# Patient Record
Sex: Male | Born: 1985 | Race: Black or African American | Hispanic: No | Marital: Single | Smoking: Current every day smoker
Health system: Southern US, Community
[De-identification: ages and names within clinical notes are randomized; demographics above are authoritative.]

## PROBLEM LIST (undated history)

## (undated) ENCOUNTER — Emergency Department
Discharge status: Against Medical Advice | Payer: Self-pay | Attending: Emergency Medicine | Admitting: Emergency Medicine

## (undated) HISTORY — PX: ORTHOPEDIC SURGERY: SHX850

---

## 2012-08-02 ENCOUNTER — Emergency Department
Admission: EM | Admit: 2012-08-02 | Disposition: A | Discharge status: Home / Self Care | Payer: Self-pay | Source: Ambulatory Visit

## 2012-10-02 ENCOUNTER — Ambulatory Visit
Admission: RE | Admit: 2012-10-02 | Disposition: A | Discharge status: Home / Self Care | Payer: Self-pay | Source: Ambulatory Visit

## 2013-10-04 DIAGNOSIS — Z5320 Procedure and treatment not carried out because of patient's decision for unspecified reasons: Secondary | ICD-10-CM | POA: Insufficient documentation

## 2013-11-24 ENCOUNTER — Emergency Department: Payer: Self-pay

## 2013-11-24 ENCOUNTER — Emergency Department
Admission: EM | Admit: 2013-11-24 | Discharge: 2013-11-24 | Disposition: A | Discharge status: Home / Self Care | Payer: Self-pay | Attending: Emergency Medicine | Admitting: Emergency Medicine

## 2013-11-24 DIAGNOSIS — F17200 Nicotine dependence, unspecified, uncomplicated: Secondary | ICD-10-CM | POA: Insufficient documentation

## 2013-11-24 DIAGNOSIS — J111 Influenza due to unidentified influenza virus with other respiratory manifestations: Secondary | ICD-10-CM | POA: Insufficient documentation

## 2013-11-24 LAB — URINALYSIS
Bilirubin, UA: NEGATIVE mg/dL
Blood, UA: NEGATIVE mg/dL
Glucose, UA: NEGATIVE mg/dL
Ketones UA: NEGATIVE mg/dL
Leukocyte Esterase, UA: NEGATIVE Leu/uL
Nitrite, UA: NEGATIVE
Protein, UR: NEGATIVE mg/dL
RBC, UA: 3 /hpf (ref 0–4)
Urine Specific Gravity: 1.031 (ref 1.001–1.040)
Urobilinogen, UA: NORMAL mg/dL
WBC, UA: 4 /hpf (ref 0–4)
pH, Urine: 5 pH (ref 5.0–8.0)

## 2013-11-24 MED ORDER — HYDROCODONE-ACETAMINOPHEN 5-325 MG PO TABS
1.0000 | ORAL_TABLET | Freq: Once | ORAL | Status: AC
Start: 2013-11-24 — End: 2013-11-24
  Administered 2013-11-24: 1 via ORAL

## 2013-11-24 MED ORDER — IBUPROFEN 600 MG PO TABS
600.0000 mg | ORAL_TABLET | Freq: Once | ORAL | Status: AC
Start: 2013-11-24 — End: 2013-11-24
  Administered 2013-11-24: 600 mg via ORAL

## 2013-11-24 MED ORDER — HYDROCODONE-ACETAMINOPHEN 5-325 MG PO TABS
ORAL_TABLET | ORAL | Status: AC
Start: 2013-11-24 — End: ?
  Filled 2013-11-24: qty 1

## 2013-11-24 MED ORDER — HYDROCODONE-ACETAMINOPHEN 5-325 MG PO TABS
1.0000 | ORAL_TABLET | Freq: Four times a day (QID) | ORAL | Status: DC | PRN
Start: 2013-11-24 — End: 2018-03-10

## 2013-11-24 MED ORDER — ONDANSETRON 8 MG PO TBDP
8.00 mg | ORAL_TABLET | Freq: Two times a day (BID) | ORAL | Status: AC | PRN
Start: 2013-11-24 — End: ?

## 2013-11-24 MED ORDER — IBUPROFEN 600 MG PO TABS
ORAL_TABLET | ORAL | Status: AC
Start: 2013-11-24 — End: ?
  Filled 2013-11-24: qty 1

## 2013-11-24 NOTE — Discharge Instructions (Signed)
Influenza (Adult)    Influenza, also called the flu, is a viral illness that affects the air passages of the lungs. It differs from the common cold. It is highly contagious. It may be spread through the air by coughing and sneezing or by direct contact (touching the sick person and then touching your own eyes, nose or mouth).  Illness starts 1-3 days after exposure and lasts for 1-2 weeks. Antibiotics are usually not needed unless a complication appears (ear or sinus infection or pneumonia).  Symptoms may be mild or severe and can include extreme tiredness (wanting to stay in bed all day), chills, fevers, muscle aching, soreness with eye movement, headache, and a dry, hacking cough.  Home Care:   Avoid exposure to cigarette smoke (yours or others').   Tylenol or ibuprofen (Advil) will help fever, muscle aching, and headache. To avoid risk of liver injury, aspirin should not be used in children and teenagers under 18 with this illness.   Nausea and loss of appetite are common. A light diet is recommended. Avoid dehydration by drinking 6-8 glasses of fluids per day (water, sport drinks like Gatorade, soft drinks without caffeine, juices, tea, soup, etc.). Extra fluids will also help loosen secretions in the nose and lungs.   Over-the-counter cold medicines will not shorten the duration of the illness but may be helpful for the following symptoms: cough (Robitussin DM); sore throat (Chloraseptic lozenges or spray); nasal and sinus congestion (Actifed or Sudafed). [NOTE: Do not use decongestants if you have high blood pressure.]   Stay home until your fever has been gone for at least 24 hours (without the use of fever-reducing medications such as ibuprofen).  Follow Up  with your doctor or as directed by our staff if you are not improving over the next week.  Note: If you are age 65 or older, or if you have chronic asthma or COPD, we recommend a pneumococcal vaccinationevery five years. All adults shouldreceive  a yearly influenza vaccination every autumn. Ask your doctor about this.  Get Prompt Medical Attention  if any of the following occur:   Cough with lots of colored sputum (mucus) or blood in your sputum   Chest pain, shortness of breath, wheezing, or difficulty breathing   Severe headache, face, neck or ear pain   New rash   Fever of 100.4F (38C) oral or higher, not better with fever medication   Confusion, behavior change or seizure   Severe weakness or dizziness   2000-2014 [employer], [address], [place] [address]. All rights reserved. This information is not intended as a substitute for professional medical care. Always follow your healthcare professional's instructions.

## 2013-11-24 NOTE — ED Provider Notes (Signed)
Physician/Midlevel provider first contact with patient: 11/24/13 4742         EMERGENCY DEPARTMENT HISTORY AND PHYSICAL EXAM      Date Time: 11/24/2013 7:17 AM  Patient Name: Willie Gibbs  Attending Physician: Marvene Staff, MD    Assessment/Plan:   Viral syndrome - likely influenza  Fluids, Zofran, Ibuprofen otc, Norco      MDM   The patient's presentation is suggestive of a viral syndrome. The patient is clinically well appearing. Bacterial or other serious etiologies like strep throat, meningitis, lyme disease, pneumonia were considered but are felt unlikely based upon the history and exam. There is no history of immune compromise. There are no meningeal or sepsis indicators. The patient appears stable for discharge and fever precautions were given. The patient was encouraged to follow-up with their primary care physician as needed and for review of pending labs if instructed to do so.  Diagnostic impression and plan were discussed with the patient and/or family.  Results of lab/radiology tests were reviewed and discussed with the patient and/or family. All questions were answered and concerns addressed. The patient appears appropriate for outpatient management with symptomatic treatment and primary care physician follow-up.  Warned to return immediately for worsening symptoms or any concerns.    History of Presenting Illness:   History provided by:  PATIENT  Willie Gibbs is a 28 y.o. male     PATIENT COMPLAINS OF CONGESTION, BODY ACHES, COUGH, SORE THROAT, AND VOMITING.  SYMPTOMS HAVE BEEN PRESENT FOR ABOUT A WEEK.  PATIENT HAD A FEVER LAST NIGHT.  DENIES RASHES OR URINARY SYMPTOMS, BUT HE TOOK A HOME UTI TEST THAT CAME BACK POSITIVE.      Past Medical History:   History reviewed. No pertinent past medical history.    Past Surgical History:     Past Surgical History   Procedure Date   . Orthopedic surgery        Family History:   History reviewed. No pertinent family history.    Social  History:     History     Social History   . Marital Status: Single     Spouse Name: N/A     Number of Children: N/A   . Years of Education: N/A     Social History Main Topics   . Smoking status: Current Some Day Smoker   . Smokeless tobacco: Not on file   . Alcohol Use: No   . Drug Use: No   . Sexually Active: Not on file     Other Topics Concern   . Not on file     Social History Narrative   . No narrative on file       Allergies:   No Known Allergies    Medications:     Previous Medications    No medications on file        Review of Systems:   Constitutional: FEVER.  No chills    Ears:  Bilateral earache    Nose:  CONGESTION.  No discharge    Throat:  SORE THROAT.  No difficulty swallowing.    Cardiovascular: No chest pain.  No palpitations.    Respiratory: COUGH.  No shortness of breath.    GI:  No abdominal pain.  VOMITING.  No diarrhea.    GU:  No dysuria.    Neurological:  Moderate frontal headache.      Musculoskeletal:  Generalized body pain.    Skin:  No rash.  No  skin lesions.    Psychiatric:  No depression.  No anxiety.      All other systems reviewed and negative except as above, pertinent findings in HPI.      Physical Exam:   Blood pressure 123/94, pulse 103, temperature 99 F (37.2 C), resp. rate 16, height 1.727 m, weight 77.8 kg, SpO2 98.00%.  Constitutional:  Vitals signs reviewed. Moderately ill-appearing.  Well-nourished. No acute distress.    Head:  Atraumatic, normocephalic    Eyes:  Pupils equal, round, and reactive to light and accomodation.  Conjunctiva no injection or erythema    Ears: TMs normal.  Ear canals patent.      Nose:  Mucous membranes moist.  No discharge.     Mouth & Throat:   Mild erythema.  No exudates     Neck:  Supple, non tender.  Moderate cervical lymphadenopathy.    Respiratory:  Breath sounds normal.  No distress    Chest:  Non tender    Cardiovascular:  Heart regular rate and rhythm.  No murmurs/gallops/rubs.  Pulses +2 bilaterally    Back:  No CVA tenderness  bilaterally    Extremities:  Full range of motion.  No edema.  No cyanosis.  No deformity.    Skin:  Warm.  Moist.  No pallor.  No rashes.  No lesions.  No bruises    Neurological:  Alert. GCS 15.  No focal motor deficits.      Psychiatric:  Normal affect.  No anxiety.  No depression.  No agitation.      Lab Results       Labs Reviewed   URINALYSIS   VH CULTURE, URINE       Radiology Results     XR CHEST 2 VIEWS    Final Result: Normal chest.         ReadingStation:[id number]       Labs and Radiological Studies Reviewed    Final Impression     Final diagnoses:   Influenza       Disposition     ED Disposition     Discharge Willie Gibbs discharge to home/self care.    Condition at disposition: Stable            Follow-Up Provider   Frankey Shown, DO  [address]  [place] [address]  [phone]    Schedule an appointment as soon as possible for a visit            Marvene Staff, MD    ATTESTATIONS    The documentation recorded by my scribe, MATTHEW PEWORCHIK, accurately reflects the services I personally performed and the decisions made by me.  Derwin Reddy Ames Dura, MD    Marvene Staff, MD  11/24/13 [phone]

## 2013-11-24 NOTE — ED Notes (Signed)
Patient ambulated to xray without any difficulty

## 2013-11-24 NOTE — ED Notes (Signed)
C/o flu like symptoms for the last week,  Fever, chills, body aches  Today worse cough

## 2014-06-06 ENCOUNTER — Emergency Department (HOSPITAL_COMMUNITY)
Admission: EM | Admit: 2014-06-06 | Discharge: 2014-06-06 | Disposition: A | Discharge status: Home / Self Care | Payer: Self-pay | Attending: Emergency Medicine | Admitting: Emergency Medicine

## 2014-06-06 ENCOUNTER — Encounter (HOSPITAL_COMMUNITY): Payer: Self-pay | Admitting: Emergency Medicine

## 2014-06-06 DIAGNOSIS — Z880 Allergy status to penicillin: Secondary | ICD-10-CM | POA: Insufficient documentation

## 2014-06-06 DIAGNOSIS — L03319 Cellulitis of trunk, unspecified: Principal | ICD-10-CM

## 2014-06-06 DIAGNOSIS — F17200 Nicotine dependence, unspecified, uncomplicated: Secondary | ICD-10-CM | POA: Insufficient documentation

## 2014-06-06 DIAGNOSIS — L02219 Cutaneous abscess of trunk, unspecified: Secondary | ICD-10-CM | POA: Insufficient documentation

## 2014-06-06 DIAGNOSIS — L0291 Cutaneous abscess, unspecified: Secondary | ICD-10-CM

## 2014-06-06 MED ORDER — SULFAMETHOXAZOLE-TRIMETHOPRIM 800-160 MG PO TABS: 1.0000 | ORAL_TABLET | Freq: Two times a day (BID) | ORAL | Status: DC

## 2014-06-06 MED ORDER — CEPHALEXIN 500 MG PO CAPS: ORAL_CAPSULE | ORAL | Status: DC

## 2014-06-06 NOTE — ED Notes (Signed)
Pt complains of boil on Right upper thigh/groin area. Reports that it has come a head and causes back 3/10 and leg pain 8/10. A/O

## 2014-06-06 NOTE — Discharge Instructions (Signed)
Abscess °An abscess (boil or furuncle) is an infected area on or under the skin. This area is filled with yellowish-white fluid (pus) and other material (debris). °HOME CARE  °· Only take medicines as told by your doctor. °· If you were given antibiotic medicine, take it as directed. Finish the medicine even if you start to feel better. °· If gauze is used, follow your doctor's directions for changing the gauze. °· To avoid spreading the infection: °¨ Keep your abscess covered with a bandage. °¨ Wash your hands well. °¨ Do not share personal care items, towels, or whirlpools with others. °¨ Avoid skin contact with others. °· Keep your skin and clothes clean around the abscess. °· Keep all doctor visits as told. °GET HELP RIGHT AWAY IF:  °· You have more pain, puffiness (swelling), or redness in the wound site. °· You have more fluid or blood coming from the wound site. °· You have muscle aches, chills, or you feel sick. °· You have a fever. °MAKE SURE YOU:  °· Understand these instructions. °· Will watch your condition. °· Will get help right away if you are not doing well or get worse. °Document Released: 04/27/2008 Document Revised: 05/10/2012 Document Reviewed: 01/22/2012 °[employer]® Patient Information ©2015 [employer]. This information is not intended to replace advice given to you by your health care provider. Make sure you discuss any questions you have with your health care provider. ° °

## 2014-06-06 NOTE — ED Provider Notes (Signed)
CSN: [id number]     Arrival date & time 06/06/14  1157 History  This chart was scribed for non-physician practitioner Junious Silk, PA-C, working with Suzi Roots, MD by Leone Payor, ED Scribe. This patient was seen in room TR11C/TR11C and the patient's care was started at 1:30 PM.    Chief Complaint  Patient presents with  . Recurrent Skin Infections    boil    The history is provided by the patient. No language interpreter was used.    HPI Comments: Nicholas Howe is a 28 y.o. male who presents to the Emergency Department complaining of 2 days of gradual onset, gradually worsening, constant pain, swelling, and redness to the right groin. He reports similar symptoms in the past when he was diagnosed with abscesses. He describes the pain as a burning sensation. The pain radiates into his leg and back when he walks. He denies trying home remedies for his symptoms. He denies fever, chills, nausea, vomiting.   History reviewed. No pertinent past medical history. History reviewed. No pertinent past surgical history. No family history on file. History  Substance Use Topics  . Smoking status: Current Every Day Smoker -- 0.10 packs/day    Types: Cigarettes  . Smokeless tobacco: Not on file  . Alcohol Use: No    Review of Systems  Constitutional: Negative for fever and chills.  Gastrointestinal: Negative for nausea and vomiting.  Skin: Positive for wound (abscess).  All other systems reviewed and are negative.     Allergies  Penicillins  Home Medications   Prior to Admission medications   Not on File   BP 121/69  Pulse 81  Temp(Src) 98.9 F (37.2 C) (Oral)  Resp 17  Ht 6' (1.829 m)  Wt 160 lb (72.576 kg)  BMI 21.70 kg/m2  SpO2 97% Physical Exam  Nursing note and vitals reviewed. Constitutional: He is oriented to person, place, and time. He appears well-developed and well-nourished. No distress.  HENT:  Head: Normocephalic and atraumatic.  Right Ear: External ear  normal.  Left Ear: External ear normal.  Nose: Nose normal.  Eyes: Conjunctivae are normal.  Neck: Normal range of motion. No tracheal deviation present.  Cardiovascular: Normal rate, regular rhythm and normal heart sounds.   Pulmonary/Chest: Effort normal and breath sounds normal. No stridor.  Abdominal: Soft. He exhibits no distension. There is no tenderness.  Genitourinary:     Musculoskeletal: Normal range of motion.  Neurological: He is alert and oriented to person, place, and time.  Skin: Skin is warm and dry. He is not diaphoretic. There is erythema.  2 cm area of induration and erythema with central pustule to right groin. No testicular involvement.   Psychiatric: He has a normal mood and affect. His behavior is normal.    ED Course  Procedures (including critical care time)  DIAGNOSTIC STUDIES: Oxygen Saturation is 97% on RA, adequate by my interpretation.    COORDINATION OF CARE: 1:38 PM Will perform I&D. Discussed treatment plan with pt at bedside and pt agreed to plan.   Labs Review Labs Reviewed - No data to display  Imaging Review No results found.   EKG Interpretation None      INCISION AND DRAINAGE Performed by: Junious Silk Consent: Verbal consent obtained. Risks and benefits: risks, benefits and alternatives were discussed Type: abscess  Body area: right groin  Anesthesia: local infiltration  Incision was made with a scalpel.  Local anesthetic: lidocaine 2 %   Anesthetic total: 3 ml  Complexity:  complex Blunt dissection to break up loculations  Drainage: purulent  Drainage amount: copious  Patient tolerance: Patient tolerated the procedure well with no immediate complications.     MDM   Final diagnoses:  Abscess    Patient with skin abscess amenable to incision and drainage.  Abscess was not large enough to warrant packing or drain,  wound recheck in 2 days. Encouraged home warm soaks and flushing.  Mild signs of cellulitis  is surrounding skin.  Will d/c to home.    I personally performed the services described in this documentation, which was scribed in my presence. The recorded information has been reviewed and is accurate.    Mora BellmanHannah S Shahzaib Azevedo, PA-C 06/07/14 [phone]

## 2014-06-13 NOTE — ED Provider Notes (Signed)
Medical screening examination/treatment/procedure(s) were performed by non-physician practitioner and as supervising physician I was immediately available for consultation/collaboration.     Suzi RootsKevin E Ifeanyi Mickelson, MD 06/13/14 [phone]

## 2016-01-30 ENCOUNTER — Telehealth: Payer: Self-pay | Admitting: General Practice

## 2016-01-30 NOTE — Telephone Encounter (Signed)
APPT. REMINDER CALL, LMTCB °

## 2016-01-31 ENCOUNTER — Ambulatory Visit (INDEPENDENT_AMBULATORY_CARE_PROVIDER_SITE_OTHER): Payer: BLUE CROSS/BLUE SHIELD | Admitting: Internal Medicine

## 2016-01-31 ENCOUNTER — Encounter: Payer: Self-pay | Admitting: Internal Medicine

## 2016-01-31 VITALS — BP 115/78 | HR 82 | Temp 98.2°F | Ht 72.0 in | Wt 148.9 lb

## 2016-01-31 DIAGNOSIS — N50812 Left testicular pain: Secondary | ICD-10-CM | POA: Diagnosis not present

## 2016-01-31 NOTE — Patient Instructions (Signed)
We will check some labs today. Take ibuprofen 600mg  every 4-6 hours as needed for pain.  If you pain does not improve please call us.

## 2016-01-31 NOTE — Progress Notes (Signed)
Subjective:    Patient ID: Nicholas Howe, male    DOB: 1986-06-22, 30 y.o.   MRN: [id number]  HPI  30 yo male with remote hx of syncope presents to establish care as a new patient.  Has not seen a doctor since his childhood. In his teen years he had few episodes of syncope. Had cardiac monitor place per patient and everything was "normal" per him. He can't remember which doctor he saw for this.  Today he has acute complaint of left testicular discomfort. It started 1 week ago. He feels that the left testicular feels fuller than the right. Not having much pain but just some discomfort especially with walking. Denies any penile discharge or any rashes. No joint pain, lymph node enlargements, night sweats, fever, n/v/d. Sexually active with 1 male partner for last 6 months. No hx of STDs. He feels that due to the discomfort he had some trouble achieving erection few days ago. Feels there might be some performance anxiety causing erection problem as they are still new in the relationship. Denies any injury or heavy lifting. Has not tried anything for the discomfort.  No other complaints. No on any medications. Has allergy to penicillin, not sure what it was (always have been told in the past by his parents).   FH: MGF prostate cancer, maternal and paternal grand mothers have diabetes. No heart disease, HTN, or any other cancer in the family.  No surgeries.  SH: smokes 1 cigar daily. Smokes marijuana daily. No IV drug use. No ETOH. Sexually active with 1 male partner.    Review of Systems  Constitutional: Negative for fever, chills and fatigue.  HENT: Negative for congestion, rhinorrhea and sore throat.   Eyes: Negative for photophobia and visual disturbance.  Respiratory: Negative for chest tightness, shortness of breath and wheezing.   Cardiovascular: Negative for chest pain, palpitations and leg swelling.  Gastrointestinal: Negative for nausea, vomiting, abdominal pain, diarrhea,  blood in stool and abdominal distention.  Endocrine: Negative.  Negative for polyphagia and polyuria.  Genitourinary: Positive for testicular pain. Negative for dysuria, urgency, frequency, hematuria, flank pain, decreased urine volume, discharge, penile swelling, scrotal swelling, enuresis, difficulty urinating, genital sores and penile pain.  Musculoskeletal: Negative for myalgias, back pain, joint swelling and arthralgias.  Skin: Negative for rash and wound.  Allergic/Immunologic: Negative.   Neurological: Negative for dizziness, facial asymmetry, light-headedness and headaches.  Psychiatric/Behavioral: Negative.        Objective:   Physical Exam  Constitutional: He is oriented to person, place, and time. He appears well-developed and well-nourished. No distress.  Pleasant male.  HENT:  Head: Normocephalic and atraumatic.  Mouth/Throat: Oropharynx is clear and moist. No oropharyngeal exudate.  Eyes: EOM are normal. Pupils are equal, round, and reactive to light. Right eye exhibits no discharge. Left eye exhibits no discharge.  Neck: Normal range of motion. No JVD present.  Cardiovascular: Normal rate, regular rhythm and normal heart sounds.  Exam reveals no gallop and no friction rub.   No murmur heard. Pulmonary/Chest: Effort normal and breath sounds normal. No respiratory distress. He has no wheezes. He exhibits no tenderness.  Abdominal: Soft. Bowel sounds are normal. He exhibits no distension and no mass. There is no tenderness.  Genitourinary:  Scrotum are both grossly equal in size and height.  No pain with movement or palpation. No swelling. No penile discharge or lesions.  No hernia or adenopathy noted.   Musculoskeletal: Normal range of motion. He exhibits no edema or tenderness.  Lymphadenopathy:    He has no cervical adenopathy.  Neurological: He is alert and oriented to person, place, and time. No cranial nerve deficit. Coordination normal.  Skin: He is not diaphoretic.      Filed Vitals:   01/31/16 1030  BP: 115/78  Pulse: 82  Temp: 98.2 F (36.8 C)          Assessment & Plan:  See problem based a&p.

## 2016-01-31 NOTE — Assessment & Plan Note (Signed)
Unclear cause of testicular pain. Testicular exam was normal without tenderness, swelling, or any penile discharge or mass.   No known hx of trauma or heavy lifting.  There is low suspicious for any infectious process but this could be mild case of ependymitis. He is at risk of STD given use of marijuana, young age, and recent sex partner.   - Likely 2/2 to functional pain. Will do PRN ibuprofen for pain. If pain does not resolve then we will do ultrasound in the future.  - will check for screening HIV. Also check urine GC/Chlymydia. If positive then we will treat him.

## 2016-02-01 LAB — HIV ANTIBODY (ROUTINE TESTING W REFLEX): HIV Screen 4th Generation wRfx: NONREACTIVE

## 2016-02-03 LAB — URINE CYTOLOGY ANCILLARY ONLY
Chlamydia: NEGATIVE
NEISSERIA GONORRHEA: NEGATIVE

## 2016-02-04 NOTE — Progress Notes (Signed)
[facility] Attending  Case discussed with Dr. Ahmed at the time of the visit.  We reviewed the resident's history and exam and pertinent patient test results.  I agree with the assessment, diagnosis, and plan of care documented in the resident's note. 

## 2021-11-24 ENCOUNTER — Ambulatory Visit
Admission: EM | Admit: 2021-11-24 | Discharge: 2021-11-24 | Disposition: A | Discharge status: Home / Self Care | Payer: 59 | Attending: Physician Assistant | Admitting: Physician Assistant

## 2021-11-24 ENCOUNTER — Other Ambulatory Visit: Payer: Self-pay

## 2021-11-24 DIAGNOSIS — J069 Acute upper respiratory infection, unspecified: Secondary | ICD-10-CM

## 2021-11-24 MED ORDER — OSELTAMIVIR PHOSPHATE 75 MG PO CAPS: 75.0000 mg | ORAL_CAPSULE | Freq: Two times a day (BID) | ORAL | 0 refills | Status: DC

## 2021-11-24 NOTE — ED Triage Notes (Signed)
3 day h/o body aches, cough and dry mouth. Pt reports that he has been waking up in cold sweats. Pt reports that he can taste mucous when he drinks and eats. He also feels like everything he smells stinks. Has been taking dayquil and nyquil with some body pain relief. No v/d.

## 2021-11-24 NOTE — ED Provider Notes (Signed)
[facility]    CSN: [id number] Arrival date & time: 11/24/21  1241      History   Chief Complaint Chief Complaint  Patient presents with   Cough   Generalized Body Aches    HPI Nicholas Howe is a 36 y.o. male.   Patient here today for evaluation of body aches, dry mouth, and cough he has had the last 3 days. He reports he is waking in cold sweats. He has taken OTC meds with mild relief.   The history is provided by the patient.  Cough Associated symptoms: chills, diaphoresis, fever (subjective) and myalgias   Associated symptoms: no ear pain, no eye discharge, no shortness of breath and no sore throat    History reviewed. No pertinent past medical history.  Patient Active Problem List   Diagnosis Date Noted   Testicular pain, left 01/31/2016    History reviewed. No pertinent surgical history.     Home Medications    Prior to Admission medications   Medication Sig Start Date End Date Taking? Authorizing Provider  oseltamivir (TAMIFLU) 75 MG capsule Take 1 capsule (75 mg total) by mouth every 12 (twelve) hours. 11/24/21  Yes Tomi Bamberger, PA-C    Family History Family History  Problem Relation Age of Onset   Diabetes Maternal Grandmother    Diabetes Paternal Grandmother    Heart failure Neg Hx    Stroke Neg Hx    Prostate cancer Maternal Grandfather     Social History Social History   Tobacco Use   Smoking status: Every Day    Packs/day: 0.10    Types: Cigars, Cigarettes  Substance Use Topics   Alcohol use: No    Alcohol/week: 0.0 standard drinks   Drug use: Yes    Types: Marijuana     Allergies   Penicillins and Shrimp [shellfish allergy]   Review of Systems Review of Systems  Constitutional:  Positive for chills, diaphoresis and fever (subjective).  HENT:  Positive for congestion. Negative for ear pain and sore throat.   Eyes:  Negative for discharge and redness.  Respiratory:  Positive for cough. Negative for shortness  of breath.   Gastrointestinal:  Negative for abdominal pain, nausea and vomiting.  Musculoskeletal:  Positive for myalgias.    Physical Exam Triage Vital Signs ED Triage Vitals  Enc Vitals Group     BP 11/24/21 1422 111/71     Pulse Rate 11/24/21 1422 70     Resp 11/24/21 1422 18     Temp 11/24/21 1422 98.5 F (36.9 C)     Temp Source 11/24/21 1422 Oral     SpO2 11/24/21 1422 96 %     Weight --      Height --      Head Circumference --      Peak Flow --      Pain Score 11/24/21 1425 0     Pain Loc --      Pain Edu? --      Excl. in [place]? --    No data found.  Updated Vital Signs BP 111/71 (BP Location: Left Arm)    Pulse 70    Temp 98.5 F (36.9 C) (Oral)    Resp 18    SpO2 96%     Physical Exam Vitals and nursing note reviewed.  Constitutional:      General: He is not in acute distress.    Appearance: Normal appearance. He is not ill-appearing.  HENT:  Head: Normocephalic and atraumatic.     Nose: Congestion present.     Mouth/Throat:     Mouth: Mucous membranes are moist.     Pharynx: Oropharynx is clear. No oropharyngeal exudate or posterior oropharyngeal erythema.  Eyes:     Conjunctiva/sclera: Conjunctivae normal.  Cardiovascular:     Rate and Rhythm: Normal rate and regular rhythm.     Heart sounds: Normal heart sounds. No murmur heard. Pulmonary:     Effort: Pulmonary effort is normal. No respiratory distress.     Breath sounds: Normal breath sounds. No wheezing, rhonchi or rales.  Skin:    General: Skin is warm and dry.  Neurological:     Mental Status: He is alert.  Psychiatric:        Mood and Affect: Mood normal.        Thought Content: Thought content normal.     UC Treatments / Results  Labs (all labs ordered are listed, but only abnormal results are displayed) Labs Reviewed  COVID-19, FLU A+B NAA    EKG   Radiology No results found.  Procedures Procedures (including critical care time)  Medications Ordered in UC Medications  - No data to display  Initial Impression / Assessment and Plan / UC Course  I have reviewed the triage vital signs and the nursing notes.  Pertinent labs & imaging results that were available during my care of the patient were reviewed by me and considered in my medical decision making (see chart for details).    Suspect likely viral etiology of symptoms. Will treat with tamiflu and covid and flu screening ordered. Recommended continued symptomatic treatment and follow up with any further concerns.  Final Clinical Impressions(s) / UC Diagnoses   Final diagnoses:  Acute upper respiratory infection   Discharge Instructions   None    ED Prescriptions     Medication Sig Dispense Auth. Provider   oseltamivir (TAMIFLU) 75 MG capsule Take 1 capsule (75 mg total) by mouth every 12 (twelve) hours. 10 capsule Tomi Bamberger, PA-C      PDMP not reviewed this encounter.   Tomi Bamberger, PA-C 11/24/21 1533

## 2021-11-25 LAB — COVID-19, FLU A+B NAA
Influenza A, NAA: NOT DETECTED
Influenza B, NAA: NOT DETECTED
SARS-CoV-2, NAA: DETECTED — AB

## 2021-12-23 ENCOUNTER — Encounter (HOSPITAL_BASED_OUTPATIENT_CLINIC_OR_DEPARTMENT_OTHER): Payer: Self-pay

## 2021-12-23 ENCOUNTER — Emergency Department (HOSPITAL_BASED_OUTPATIENT_CLINIC_OR_DEPARTMENT_OTHER)
Admission: EM | Admit: 2021-12-23 | Discharge: 2021-12-23 | Disposition: A | Discharge status: Home / Self Care | Payer: 59 | Attending: Emergency Medicine | Admitting: Emergency Medicine

## 2021-12-23 ENCOUNTER — Other Ambulatory Visit: Payer: Self-pay

## 2021-12-23 ENCOUNTER — Emergency Department (HOSPITAL_BASED_OUTPATIENT_CLINIC_OR_DEPARTMENT_OTHER): Payer: 59

## 2021-12-23 DIAGNOSIS — R55 Syncope and collapse: Secondary | ICD-10-CM | POA: Diagnosis not present

## 2021-12-23 DIAGNOSIS — R0602 Shortness of breath: Secondary | ICD-10-CM | POA: Insufficient documentation

## 2021-12-23 DIAGNOSIS — R42 Dizziness and giddiness: Secondary | ICD-10-CM | POA: Diagnosis not present

## 2021-12-23 DIAGNOSIS — R002 Palpitations: Secondary | ICD-10-CM | POA: Insufficient documentation

## 2021-12-23 LAB — BASIC METABOLIC PANEL
Anion gap: 6 (ref 5–15)
BUN: 16 mg/dL (ref 6–20)
CO2: 27 mmol/L (ref 22–32)
Calcium: 9.3 mg/dL (ref 8.9–10.3)
Chloride: 101 mmol/L (ref 98–111)
Creatinine, Ser: 0.81 mg/dL (ref 0.61–1.24)
GFR, Estimated: >60 mL/min (ref >60)
Glucose, Bld: 95 mg/dL (ref 70–99)
Potassium: 4.4 mmol/L (ref 3.5–5.1)
Sodium: 134 mmol/L — ABNORMAL LOW (ref 135–145)

## 2021-12-23 LAB — CBC WITH DIFFERENTIAL/PLATELET
Abs Immature Granulocytes: 0.03 10*3/uL (ref 0.00–0.07)
Basophils Absolute: 0.1 10*3/uL (ref 0.0–0.1)
Basophils Relative: 1 %
Eosinophils Absolute: 0.1 10*3/uL (ref 0.0–0.5)
Eosinophils Relative: 1 %
HCT: 48.7 % (ref 39.0–52.0)
Hemoglobin: 16.9 g/dL (ref 13.0–17.0)
Immature Granulocytes: 0 %
Lymphocytes Relative: 12 %
Lymphs Abs: 1.2 10*3/uL (ref 0.7–4.0)
MCH: 30.5 pg (ref 26.0–34.0)
MCHC: 34.7 g/dL (ref 30.0–36.0)
MCV: 87.7 fL (ref 80.0–100.0)
Monocytes Absolute: 0.5 10*3/uL (ref 0.1–1.0)
Monocytes Relative: 5 %
Neutro Abs: 8 10*3/uL — ABNORMAL HIGH (ref 1.7–7.7)
Neutrophils Relative %: 81 %
Platelets: 215 10*3/uL (ref 150–400)
RBC: 5.55 MIL/uL (ref 4.22–5.81)
RDW: 13.3 % (ref 11.5–15.5)
WBC: 9.8 10*3/uL (ref 4.0–10.5)
nRBC: 0 % (ref 0.0–0.2)

## 2021-12-23 LAB — TROPONIN I (HIGH SENSITIVITY): Troponin I (High Sensitivity): <2 ng/L (ref <18)

## 2021-12-23 NOTE — ED Provider Notes (Signed)
[facility] EMERGENCY DEPARTMENT Provider Note   CSN: [id number] Arrival date & time: 12/23/21  1002     History Chief Complaint  Patient presents with   Dizziness    Nicholas Howe is a 36 y.o. male who presents with 3 episodes morning of palpitations, lightheadedness with sensation of disequilibrium, near syncope, and associated shortness of breath and diaphoresis.  States that during these episodes lasted for about 5 minutes.  He also describes that during his episodes he felt like he was remembering clips from a movie which she describes as "dj vu".  He states that each of these episodes was accompanied by the sensation.  He has history of "extra normal beats" after evaluation in the past for syncope, presumably PVCs.  I personally reviewed the patient's medical records.  He does not carry medical diagnoses nor is he any medications daily.  HPI     Home Medications Prior to Admission medications   Medication Sig Start Date End Date Taking? Authorizing Provider  oseltamivir (TAMIFLU) 75 MG capsule Take 1 capsule (75 mg total) by mouth every 12 (twelve) hours. 11/24/21   Francene Finders, PA-C      Allergies    Penicillins and Shrimp [shellfish allergy]    Review of Systems   Review of Systems  Constitutional:  Positive for diaphoresis. Negative for activity change, appetite change, chills, fatigue and fever.  HENT: Negative.    Eyes: Negative.   Respiratory:  Positive for shortness of breath.   Cardiovascular:  Positive for palpitations. Negative for chest pain.  Gastrointestinal: Negative.   Genitourinary: Negative.   Neurological:  Positive for dizziness and light-headedness.   Physical Exam Updated Vital Signs BP 124/78    Pulse 81    Temp 97.9 F (36.6 C) (Oral)    Resp (!) 23    Ht 6' (1.829 m)    Wt 74.8 kg    SpO2 100%    BMI 22.38 kg/m  Physical Exam Vitals and nursing note reviewed.  Constitutional:      Appearance: He is not ill-appearing or  toxic-appearing.  HENT:     Head: Normocephalic and atraumatic.     Nose: Nose normal.     Mouth/Throat:     Mouth: Mucous membranes are moist.     Pharynx: No oropharyngeal exudate or posterior oropharyngeal erythema.  Eyes:     General:        Right eye: No discharge.        Left eye: No discharge.     Extraocular Movements: Extraocular movements intact.     Conjunctiva/sclera: Conjunctivae normal.     Pupils: Pupils are equal, round, and reactive to light.  Cardiovascular:     Rate and Rhythm: Normal rate and regular rhythm.     Pulses: Normal pulses.     Heart sounds: Normal heart sounds. No murmur heard. Pulmonary:     Effort: Pulmonary effort is normal. No respiratory distress.     Breath sounds: Normal breath sounds. No wheezing or rales.  Abdominal:     General: Bowel sounds are normal. There is no distension.     Palpations: Abdomen is soft.     Tenderness: There is no abdominal tenderness. There is no guarding or rebound.  Musculoskeletal:        General: No deformity.     Cervical back: Neck supple. No rigidity.  Lymphadenopathy:     Cervical: No cervical adenopathy.  Skin:    General: Skin is warm and  dry.     Capillary Refill: Capillary refill takes less than 2 seconds.  Neurological:     General: No focal deficit present.     Mental Status: He is alert and oriented to person, place, and time. Mental status is at baseline.  Psychiatric:        Mood and Affect: Mood normal.    ED Results / Procedures / Treatments   Labs (all labs ordered are listed, but only abnormal results are displayed) Labs Reviewed  BASIC METABOLIC PANEL - Abnormal; Notable for the following components:      Result Value   Sodium 134 (*)    All other components within normal limits  CBC WITH DIFFERENTIAL/PLATELET - Abnormal; Notable for the following components:   Neutro Abs 8.0 (*)    All other components within normal limits  TROPONIN I (HIGH SENSITIVITY)    EKG EKG  Interpretation  Date/Time:  Tuesday December 23 2021 10:12:05 EST Ventricular Rate:  60 PR Interval:  192 QRS Duration: 80 QT Interval:  387 QTC Calculation: 387 R Axis:   60 Text Interpretation: Sinus rhythm Consider left ventricular hypertrophy Minor diffuse ST elevations can be consistent with BER and may be physiologically normal for age, clinically correlate Confirmed by Octaviano Glow [id number]) on 12/23/2021 10:29:31 AM  Radiology DG Chest 2 View  Result Date: 12/23/2021 CLINICAL DATA:  Dizziness and near-syncope. EXAM: CHEST - 2 VIEW COMPARISON:  None. FINDINGS: The heart size and mediastinal contours are within normal limits. Both lungs are clear. The visualized skeletal structures are unremarkable. IMPRESSION: No active cardiopulmonary disease. Electronically Signed   By: Titus Dubin M.D.   On: 12/23/2021 11:05    Procedures Procedures    Medications Ordered in ED Medications - No data to display  ED Course/ Medical Decision Making/ A&P                           Medical Decision Making 36 year old male presents with concern for palpitations and near syncope today.  Differential diagnosis includes but is not limited to ACS, PE, pleural effusion, pneumothorax, pneumonia, dysrhythmia.  Signs are normal intake.  Cardiopulmonary and abdominal exams are benign.  Patient is neurovascular intact in all 4 extremities and has no focal deficit on neuro exam.  Amount and/or Complexity of Data Reviewed Labs: ordered.    Details: CBC and BMP unremarkable, troponin negative, less than 2. Radiology: ordered.    Details: No acute cardiopulmonary disease on chest x-ray. ECG/medicine tests:     Details: Normal sinus rhythm, early repull, no STEMI.  Overall work-up is very reassuring.  Patient remains well-appearing without any recurrent episodes while in the emergency department.  Normal sinus rhythm on cardiac monitoring throughout his stay in the emergency department.  Cardiac  etiology of his symptoms earlier today remains unclear, there is no appear to be any emergent problem at this time.  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  We will have him follow-up with the cardiologist in the outpatient setting likely for cardiac monitor.  No further work-up warranted near this time, no indication for admission.  Mazen voiced understanding with medical evaluation and treatment plan.  Issues questions answered to his expressed inspection.  Return precautions were given.  Patient was well-appearing, stable, and was discharged in good condition.  This chart was dictated using voice recognition software, Dragon. Despite the best efforts of this provider to proofread and correct errors, errors  may still occur which can change documentation meaning.      Final Clinical Impression(s) / ED Diagnoses Final diagnoses:  Palpitations    Rx / DC Orders ED Discharge Orders     None         Aura Dials 12/23/21 1320    Wyvonnia Dusky, MD 12/23/21 1642

## 2021-12-23 NOTE — ED Triage Notes (Signed)
Pt arrives via [facility] from work with reports of dizziness. Per EMS dizziness started around 7 am felt like deja vu denies syncope, but felt that he may have syncope. Happened 3 times. CBG was 182, BP 110/70, 62 HR, 18 RR, SPO2 99%. A&OX4.   Pt states he feels that his brain is going into overdrive, felt like he was having a hot flash and breaking out into a sweat.

## 2021-12-23 NOTE — ED Notes (Signed)
Lying: 122/87               71 Sitting: 132/75                62 Standing: 127/83                66

## 2021-12-23 NOTE — Discharge Instructions (Signed)
You are seen in the ER today for your palpitations and episode of near syncope.  Your physical exam, vital signs, blood work, EKG and chest x-ray were very reassuring.  While the exact cause of your symptoms remains unclear, there is not appear to be any emergent problem at this time.  Please follow-up with a cardiologist listed below, increased respiration for the next few days and return to the ER with any new severe symptoms.

## 2022-08-12 ENCOUNTER — Ambulatory Visit
Admission: EM | Admit: 2022-08-12 | Discharge: 2022-08-12 | Disposition: A | Discharge status: Home / Self Care | Payer: 59 | Attending: Internal Medicine | Admitting: Internal Medicine

## 2022-08-12 DIAGNOSIS — H1012 Acute atopic conjunctivitis, left eye: Secondary | ICD-10-CM | POA: Diagnosis not present

## 2022-08-12 MED ORDER — OLOPATADINE HCL 0.1 % OP SOLN: 1.0000 [drp] | Freq: Two times a day (BID) | OPHTHALMIC | 12 refills | Status: AC

## 2022-08-12 NOTE — Discharge Instructions (Addendum)
It appears that your eye is having an allergic symptoms.  I have prescribed you an eyedrop that will help alleviate that.  Please follow-up with eye doctor if symptoms persist or worsen.

## 2022-08-12 NOTE — ED Triage Notes (Signed)
Pt presents with left eye irritation and pain with redness since yesterday.

## 2022-08-12 NOTE — ED Provider Notes (Signed)
[facility]    CSN: [id number] Arrival date & time: 08/12/22  2706      History   Chief Complaint Chief Complaint  Patient presents with   Eye Pain    HPI Nicholas Howe is a 36 y.o. male.   Patient presents with left eye irritation and drainage that started yesterday.  Patient reports clear drainage.  Denies trauma or foreign body from the eye.  Patient also reports that he has been having some runny nose since symptoms started.  Denies any associated fever.  Denies blurry vision.  Patient does not wear contacts or glasses.   Eye Pain    History reviewed. No pertinent past medical history.  Patient Active Problem List   Diagnosis Date Noted   Testicular pain, left 01/31/2016    History reviewed. No pertinent surgical history.     Home Medications    Prior to Admission medications   Medication Sig Start Date End Date Taking? Authorizing Provider  olopatadine (PATANOL) 0.1 % ophthalmic solution Place 1 drop into the left eye 2 (two) times daily. 08/12/22  Yes Norwin Aleman, Michele Rockers, FNP  oseltamivir (TAMIFLU) 75 MG capsule Take 1 capsule (75 mg total) by mouth every 12 (twelve) hours. 11/24/21   Francene Finders, PA-C    Family History Family History  Problem Relation Age of Onset   Diabetes Maternal Grandmother    Diabetes Paternal Grandmother    Heart failure Neg Hx    Stroke Neg Hx    Prostate cancer Maternal Grandfather     Social History Social History   Tobacco Use   Smoking status: Every Day    Packs/day: 0.10    Types: Cigars, Cigarettes  Vaping Use   Vaping Use: Never used  Substance Use Topics   Alcohol use: No    Alcohol/week: 0.0 standard drinks of alcohol   Drug use: Not Currently    Types: Marijuana     Allergies   Penicillins and Shrimp [shellfish allergy]   Review of Systems Review of Systems Per HPI  Physical Exam Triage Vital Signs ED Triage Vitals  Enc Vitals Group     BP 08/12/22 1044 122/77     Pulse Rate  08/12/22 1044 75     Resp 08/12/22 1044 18     Temp 08/12/22 1044 98.4 F (36.9 C)     Temp Source 08/12/22 1044 Oral     SpO2 08/12/22 1044 97 %     Weight --      Height --      Head Circumference --      Peak Flow --      Pain Score 08/12/22 1043 9     Pain Loc --      Pain Edu? --      Excl. in [place]? --    No data found.  Updated Vital Signs BP 122/77 (BP Location: Right Arm)   Pulse 75   Temp 98.4 F (36.9 C) (Oral)   Resp 18   SpO2 97%   Visual Acuity Right Eye Distance:   Left Eye Distance:   Bilateral Distance:    Right Eye Near: R Near: 20/20 Left Eye Near:  L Near: 20/25 Bilateral Near:  20/20  Physical Exam Constitutional:      General: He is not in acute distress.    Appearance: Normal appearance. He is not toxic-appearing or diaphoretic.  HENT:     Head: Normocephalic and atraumatic.  Eyes:     General: Lids  are normal. Lids are everted, no foreign bodies appreciated. Vision grossly intact. Gaze aligned appropriately.     Extraocular Movements: Extraocular movements intact.     Conjunctiva/sclera:     Right eye: Right conjunctiva is not injected. No chemosis, exudate or hemorrhage.    Left eye: Left conjunctiva is injected. Chemosis present. No exudate or hemorrhage.    Pupils: Pupils are equal, round, and reactive to light.     Left eye: No corneal abrasion or fluorescein uptake.  Pulmonary:     Effort: Pulmonary effort is normal.  Neurological:     General: No focal deficit present.     Mental Status: He is alert and oriented to person, place, and time. Mental status is at baseline.  Psychiatric:        Mood and Affect: Mood normal.        Behavior: Behavior normal.        Thought Content: Thought content normal.        Judgment: Judgment normal.      UC Treatments / Results  Labs (all labs ordered are listed, but only abnormal results are displayed) Labs Reviewed - No data to display  EKG   Radiology No results  found.  Procedures Procedures (including critical care time)  Medications Ordered in UC Medications - No data to display  Initial Impression / Assessment and Plan / UC Course  I have reviewed the triage vital signs and the nursing notes.  Pertinent labs & imaging results that were available during my care of the patient were reviewed by me and considered in my medical decision making (see chart for details).     Physical exam is consistent with allergic conjunctivitis.  No bacterial infection noted on exam.  Fluorescein stain completed with no reuptake.  Will prescribe Patanol drops.  Encourage patient to follow-up with provided contact information for ophthalmology if symptoms persist or worsen.  Visual acuity appears normal.  Discussed return precautions.  Patient verbalized understanding and was agreeable with plan. Final Clinical Impressions(s) / UC Diagnoses   Final diagnoses:  Allergic conjunctivitis of left eye     Discharge Instructions      It appears that your eye is having an allergic symptoms.  I have prescribed you an eyedrop that will help alleviate that.  Please follow-up with eye doctor if symptoms persist or worsen.    ED Prescriptions     Medication Sig Dispense Auth. Provider   olopatadine (PATANOL) 0.1 % ophthalmic solution Place 1 drop into the left eye 2 (two) times daily. 5 mL Gustavus Bryant, [place]      PDMP not reviewed this encounter.   Gustavus Bryant, [place] 08/12/22 1122

## 2023-01-13 ENCOUNTER — Ambulatory Visit
Admission: EM | Admit: 2023-01-13 | Discharge: 2023-01-13 | Disposition: A | Discharge status: Home / Self Care | Payer: 59 | Attending: Family Medicine | Admitting: Family Medicine

## 2023-01-13 DIAGNOSIS — J069 Acute upper respiratory infection, unspecified: Secondary | ICD-10-CM

## 2023-01-13 MED ORDER — IBUPROFEN 800 MG PO TABS: 800.0000 mg | ORAL_TABLET | Freq: Three times a day (TID) | ORAL | 0 refills | Status: AC

## 2023-01-13 MED ORDER — PROMETHAZINE-DM 6.25-15 MG/5ML PO SYRP: 5.0000 mL | ORAL_SOLUTION | Freq: Four times a day (QID) | ORAL | 0 refills | Status: AC | PRN

## 2023-01-13 NOTE — ED Triage Notes (Signed)
Pt presents with productive cough, chills, and generalized body aches X 2 days.

## 2023-01-13 NOTE — ED Provider Notes (Signed)
[facility]   [id number] 01/13/23 Arrival Time: 1119  ASSESSMENT & PLAN:  1. Viral URI with cough    Discussed typical duration of likely viral illness. Girlfriend with same; getting better. OTC symptom care as needed.  Discharge Medication List as of 01/13/2023  1:50 PM     START taking these medications   Details  ibuprofen (ADVIL) 800 MG tablet Take 1 tablet (800 mg total) by mouth 3 (three) times daily with meals., Starting Wed 01/13/2023, Normal    promethazine-dextromethorphan (PROMETHAZINE-DM) 6.25-15 MG/5ML syrup Take 5 mLs by mouth 4 (four) times daily as needed for cough., Starting Wed 01/13/2023, Normal         Follow-up Information     [facility]).   Specialty: Urgent Care Why: As needed. Contact information: [address] [place] [phone]               Work note provided. Reviewed expectations re: course of current medical issues. Questions answered. Outlined signs and symptoms indicating need for more acute intervention. Understanding verbalized. After Visit Summary given.   SUBJECTIVE: History from: Patient. Nicholas Howe is a 37 y.o. male. Pt presents with productive cough, chills, and generalized body aches X 2 days. Girlfriend with same. Denies: difficulty breathing. Normal PO intake without n/v/d.  OBJECTIVE:  Vitals:   01/13/23 1336  BP: 114/74  Pulse: 93  Resp: 17  Temp: 98.9 F (37.2 C)  TempSrc: Oral  SpO2: 97%    General appearance: alert; no distress Eyes: PERRLA; EOMI; conjunctiva normal HENT: [place]; AT; with nasal congestion Neck: supple  Lungs: speaks full sentences without difficulty; unlabored; coughing; lungs clear Extremities: no edema Skin: warm and dry Neurologic: normal gait Psychological: alert and cooperative; normal mood and affect   Allergies  Allergen Reactions   Penicillins     Was told  when he was little about allergy    Shrimp [Shellfish Allergy] Hives    Hives and tongue swelling     History reviewed. No pertinent past medical history. Social History   Socioeconomic History   Marital status: Single    Spouse name: Not on file   Number of children: Not on file   Years of education: Not on file   Highest education level: Not on file  Occupational History   Not on file  Tobacco Use   Smoking status: Every Day    Packs/day: 0.10    Types: Cigars, Cigarettes   Smokeless tobacco: Not on file  Vaping Use   Vaping Use: Never used  Substance and Sexual Activity   Alcohol use: No    Alcohol/week: 0.0 standard drinks of alcohol   Drug use: Not Currently    Types: Marijuana   Sexual activity: Yes    Partners: Female  Other Topics Concern   Not on file  Social History Narrative   Not on file   Social Determinants of Health   Financial Resource Strain: Not on file  Food Insecurity: Not on file  Transportation Needs: Not on file  Physical Activity: Not on file  Stress: Not on file  Social Connections: Not on file  Intimate Partner Violence: Not on file   Family History  Problem Relation Age of Onset   Diabetes Maternal Grandmother    Diabetes Paternal Grandmother    Heart failure Neg Hx    Stroke Neg Hx    Prostate cancer Maternal Grandfather    History reviewed.  No pertinent surgical history.   Vanessa Kick, MD 01/13/23 1352

## 2023-02-18 IMAGING — CR DG CHEST 2V
2 series · 2 of 2 positions shown · non-contrast
Comparison: None.

CLINICAL DATA: Dizziness and near-syncope.

EXAM:
CHEST - 2 VIEW

[w chest pa]
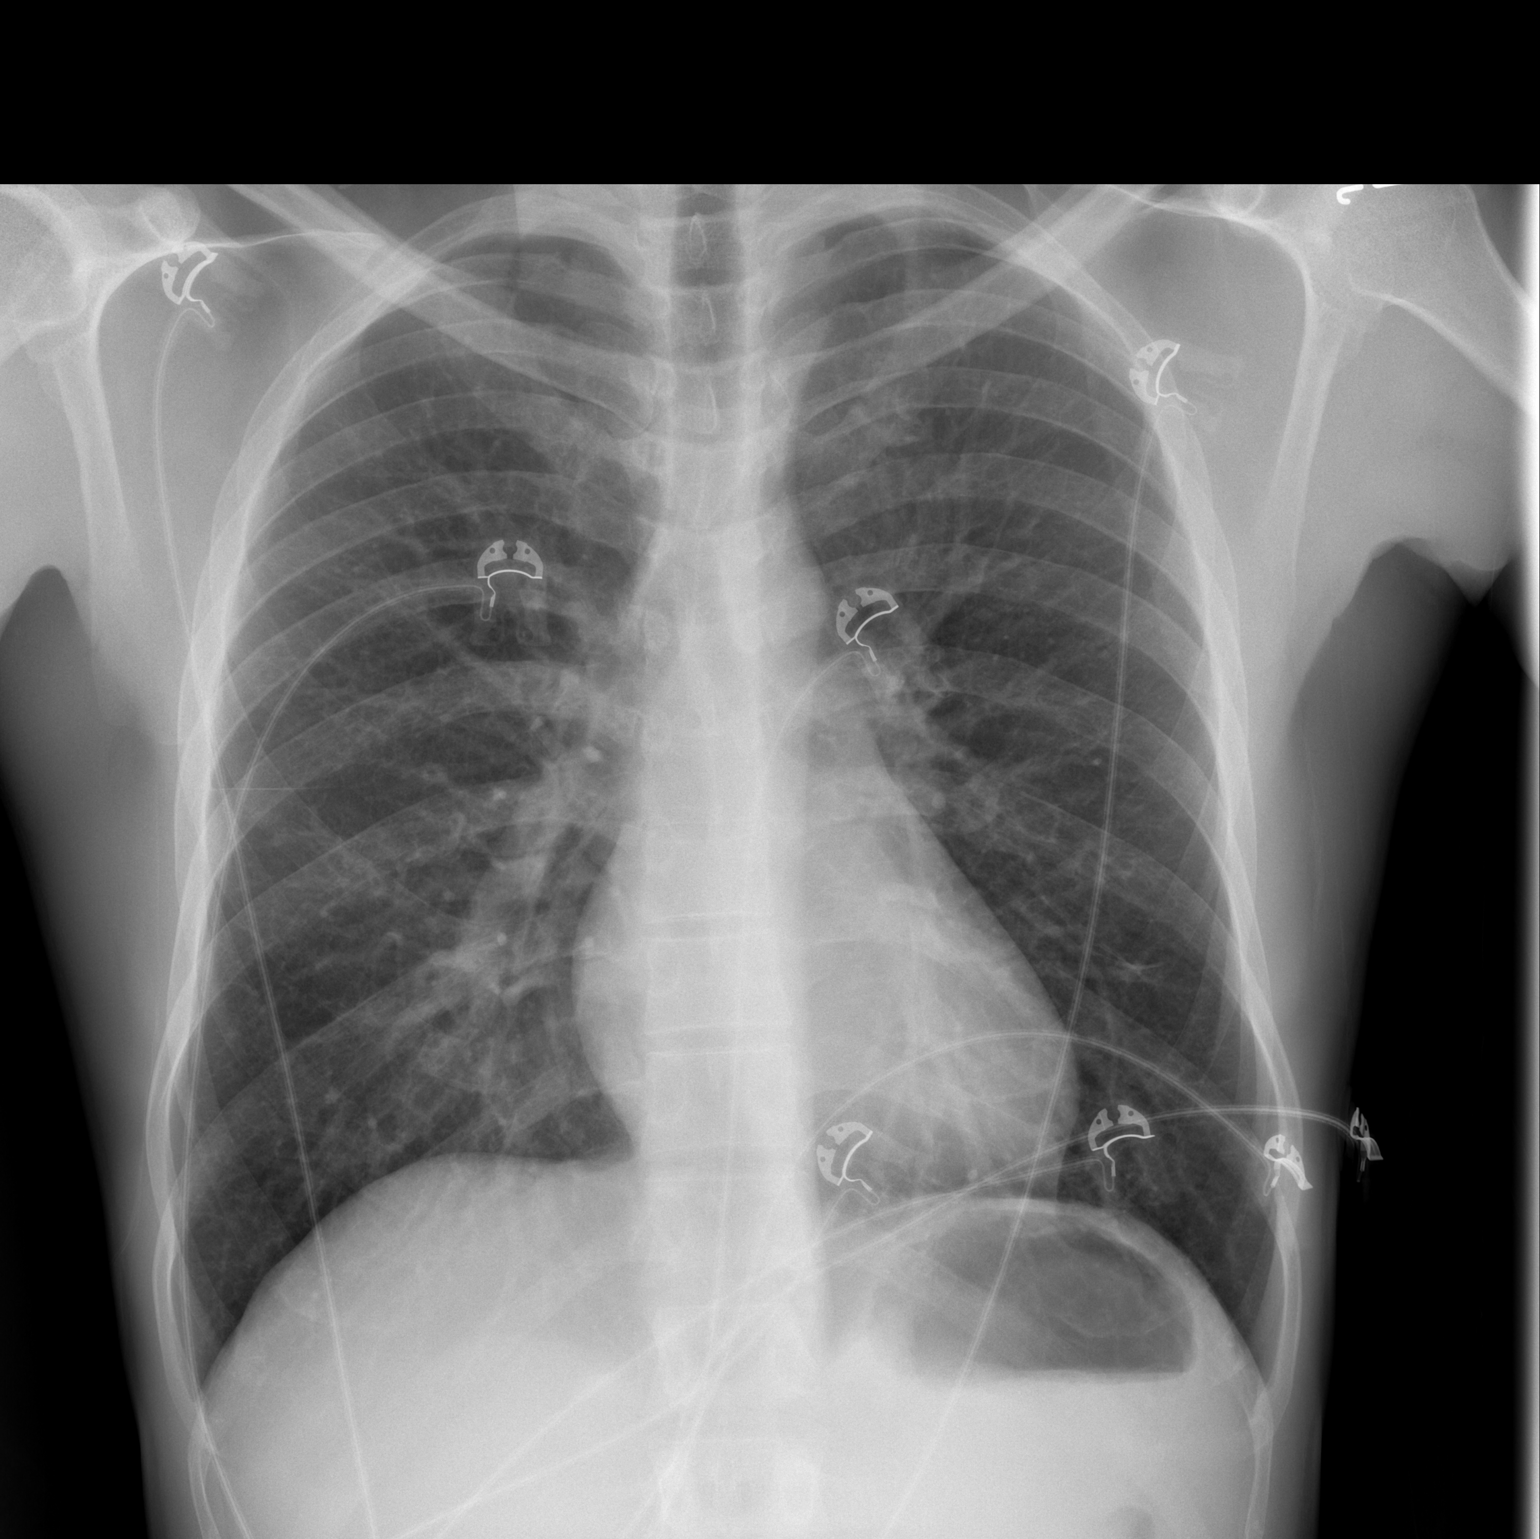

[w chest lat]
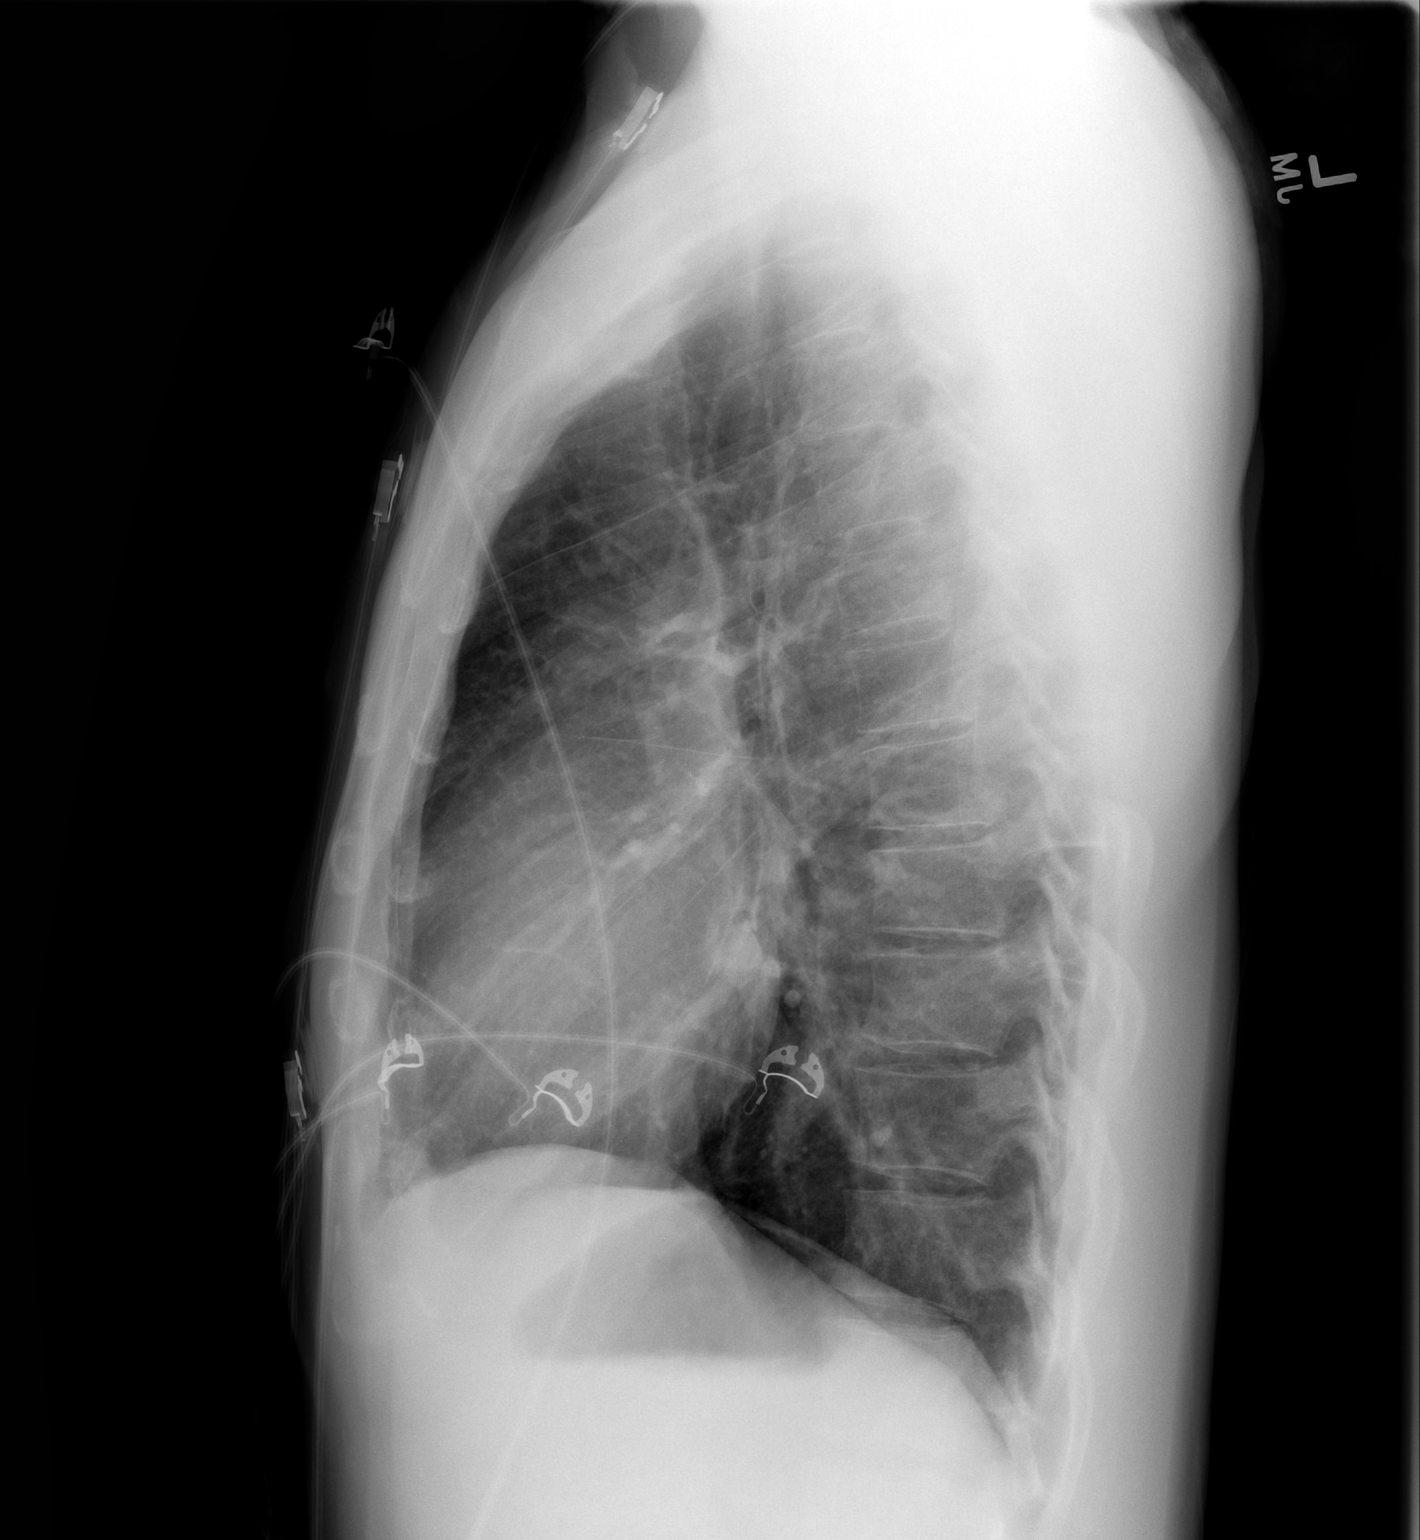

[2 of 2 positions shown; findings below may reference images not displayed]

FINDINGS: The heart size and mediastinal contours are within normal limits.
Both lungs are clear. The visualized skeletal structures are
unremarkable.
IMPRESSION: No active cardiopulmonary disease.
